# Patient Record
Sex: Female | Born: 1960 | Race: Black or African American | Hispanic: No | Marital: Married | Smoking: Never smoker
Health system: Southern US, Community
[De-identification: ages and names within clinical notes are randomized; demographics above are authoritative.]

## PROBLEM LIST (undated history)

## (undated) DIAGNOSIS — I639 Cerebral infarction, unspecified: Secondary | ICD-10-CM

## (undated) DIAGNOSIS — D649 Anemia, unspecified: Secondary | ICD-10-CM

## (undated) HISTORY — DX: Anemia, unspecified: D64.9

## (undated) HISTORY — PX: TUBAL LIGATION: SHX77

---

## 2011-03-21 ENCOUNTER — Other Ambulatory Visit: Payer: Self-pay | Admitting: Obstetrics & Gynecology

## 2011-03-21 DIAGNOSIS — N63 Unspecified lump in unspecified breast: Secondary | ICD-10-CM

## 2011-03-27 ENCOUNTER — Ambulatory Visit
Admission: RE | Admit: 2011-03-27 | Discharge: 2011-03-27 | Disposition: A | Discharge status: Home / Self Care | Payer: 59 | Source: Ambulatory Visit | Attending: Obstetrics & Gynecology | Admitting: Obstetrics & Gynecology

## 2011-03-27 DIAGNOSIS — N63 Unspecified lump in unspecified breast: Secondary | ICD-10-CM

## 2011-04-03 ENCOUNTER — Other Ambulatory Visit: Payer: Self-pay

## 2012-04-30 ENCOUNTER — Other Ambulatory Visit: Payer: Self-pay | Admitting: Obstetrics & Gynecology

## 2012-04-30 DIAGNOSIS — Z1231 Encounter for screening mammogram for malignant neoplasm of breast: Secondary | ICD-10-CM

## 2012-05-12 ENCOUNTER — Ambulatory Visit
Admission: RE | Admit: 2012-05-12 | Discharge: 2012-05-12 | Disposition: A | Discharge status: Home / Self Care | Payer: 59 | Source: Ambulatory Visit | Attending: Obstetrics & Gynecology | Admitting: Obstetrics & Gynecology

## 2012-05-12 DIAGNOSIS — Z1231 Encounter for screening mammogram for malignant neoplasm of breast: Secondary | ICD-10-CM

## 2012-06-23 ENCOUNTER — Ambulatory Visit (INDEPENDENT_AMBULATORY_CARE_PROVIDER_SITE_OTHER): Payer: 59 | Admitting: Family Medicine

## 2012-06-23 VITALS — BP 130/85 | HR 83 | Temp 98.7°F | Resp 17 | Ht 67.5 in | Wt 184.0 lb

## 2012-06-23 DIAGNOSIS — J029 Acute pharyngitis, unspecified: Secondary | ICD-10-CM

## 2012-06-23 DIAGNOSIS — H109 Unspecified conjunctivitis: Secondary | ICD-10-CM

## 2012-06-23 DIAGNOSIS — J019 Acute sinusitis, unspecified: Secondary | ICD-10-CM

## 2012-06-23 DIAGNOSIS — B009 Herpesviral infection, unspecified: Secondary | ICD-10-CM

## 2012-06-23 DIAGNOSIS — B001 Herpesviral vesicular dermatitis: Secondary | ICD-10-CM

## 2012-06-23 MED ORDER — IPRATROPIUM BROMIDE 0.03 % NA SOLN: 2.0000 | Freq: Two times a day (BID) | NASAL | Status: DC

## 2012-06-23 MED ORDER — HYDROCODONE-HOMATROPINE 5-1.5 MG/5ML PO SYRP: 5.0000 mL | ORAL_SOLUTION | Freq: Three times a day (TID) | ORAL | Status: DC | PRN

## 2012-06-23 MED ORDER — AMOXICILLIN-POT CLAVULANATE 875-125 MG PO TABS: 1.0000 | ORAL_TABLET | Freq: Two times a day (BID) | ORAL | Status: DC

## 2012-06-23 MED ORDER — VALACYCLOVIR HCL 1 G PO TABS: 1000.0000 mg | ORAL_TABLET | Freq: Two times a day (BID) | ORAL | Status: DC

## 2012-06-23 NOTE — Progress Notes (Signed)
  [facility]:  Office Visit  Chief Complaint:  Chief Complaint  Patient presents with  . Cough  . Sore Throat  . Mouth Lesions  . Conjunctivitis    HPI: Christina Daniels is a 52 y.o. female who complains of  Sore throat, Sinus congestion, HA and cold sores, and also some yellow drainage in left eye only. Denies fevers.  Chills. + Emergency planning/management officer, works from home. Has tried Abreva for lesions on mouth, throat lozenges and spray without relief. Did not get flu vaccine this year.   Past Medical History  Diagnosis Date  . Anemia    Past Surgical History  Procedure Date  . Tubal ligation    History   Social History  . Marital Status: Married    Spouse Name: N/A    Number of Children: N/A  . Years of Education: N/A   Social History Main Topics  . Smoking status: Never Smoker   . Smokeless tobacco: None  . Alcohol Use: No  . Drug Use: No  . Sexually Active: Yes    Birth Control/ Protection: None   Other Topics Concern  . None   Social History Narrative  . None   History reviewed. No pertinent family history. No Known Allergies Prior to Admission medications   Not on File     ROS: The patient denies fevers, chills, night sweats, unintentional weight loss, chest pain, palpitations, wheezing, dyspnea on exertion, nausea, vomiting, abdominal pain, dysuria, hematuria, melena, numbness, weakness, or tingling.   All other systems have been reviewed and were otherwise negative with the exception of those mentioned in the HPI and as above.    PHYSICAL EXAM: Filed Vitals:   06/23/12 1114  BP: 130/85  Pulse: 83  Temp: 98.7 F (37.1 C)  Resp: 17   Filed Vitals:   06/23/12 1114  Height: 5' 7.5" (1.715 m)  Weight: 184 lb (83.462 kg)   Body mass index is 28.39 kg/(m^2).  General: Alert, no acute distress HEENT:  Normocephalic, atraumatic, oropharynx patent. EOMI, slightly pink conjunctiva in left, + sinus tenderness. Boggy nares. Fundoscopic exa m  normal + cold sores on lip Cardiovascular:  Regular rate and rhythm, no rubs murmurs or gallops.  No Carotid bruits, radial pulse intact. No pedal edema.  Respiratory: Clear to auscultation bilaterally.  No wheezes, rales, or rhonchi.  No cyanosis, no use of accessory musculature GI: No organomegaly, abdomen is soft and non-tender, positive bowel sounds.  No masses. Skin: No rashes. Neurologic: Facial musculature symmetric. Psychiatric: Patient is appropriate throughout our interaction. Lymphatic: No cervical lymphadenopathy Musculoskeletal: Gait intact.   LABS: No results found for this or any previous visit.   EKG/XRAY:   Primary read interpreted by Dr. Conley Rolls at [facility].   ASSESSMENT/PLAN: Encounter Diagnoses  Name Primary?  . Acute sinusitis Yes  . Conjunctivitis   . Recurrent cold sores   . Pharyngitis    Rx Augmentin, Valtrex and Hydromet syrup Symptomatic treatment for sore throat with otc meds, fluids F/u prn    ,  PHUONG, DO 06/23/2012 12:26 PM

## 2012-09-23 IMAGING — US US BREAST*L*
1 series · 8 of 8 positions shown · non-contrast
Comparison: None.

CLINICAL DATA: Painful mass recently felt by the patient and on
recent physical examination in the 3 o'clock position of the left
breast.  She does not feel the mass today.

DIGITAL DIAGNOSTIC BILATERAL MAMMOGRAM WITH CAD AND LEFT BREAST
ULTRASOUND:

[Series 1: us breast*left* · 8 of 8 slices shown]
[im 1/8]
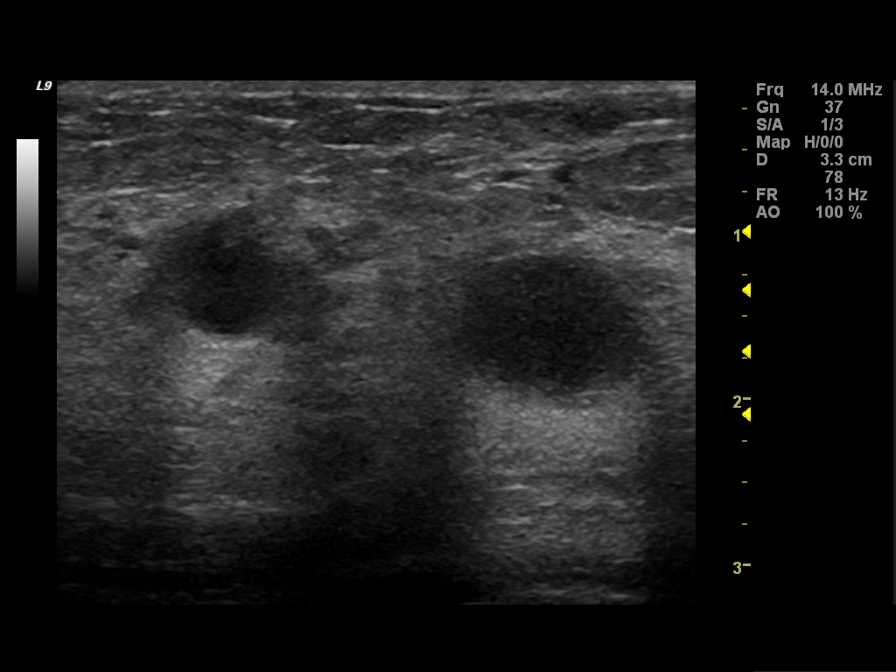
[im 2/8]
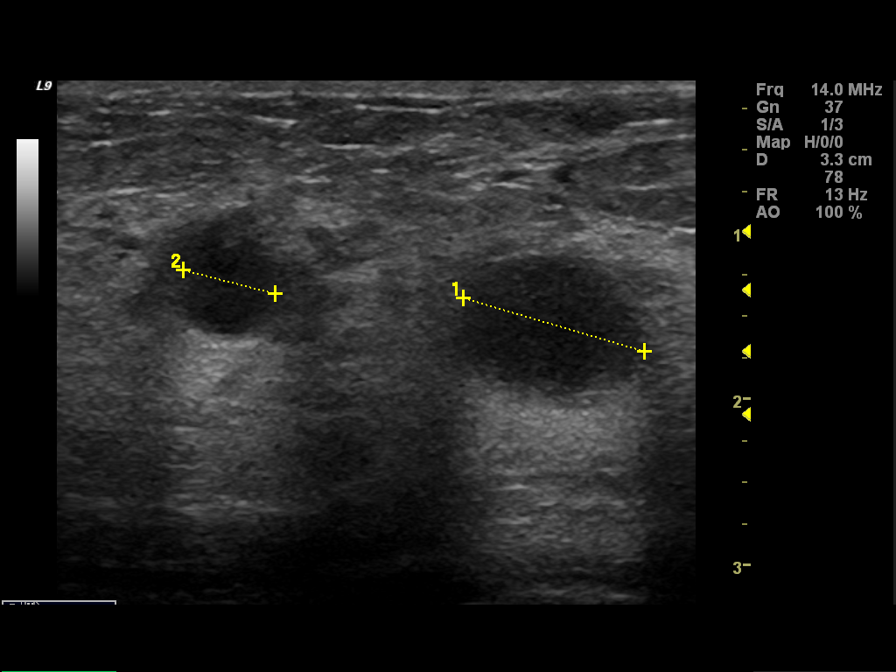
[im 3/8]
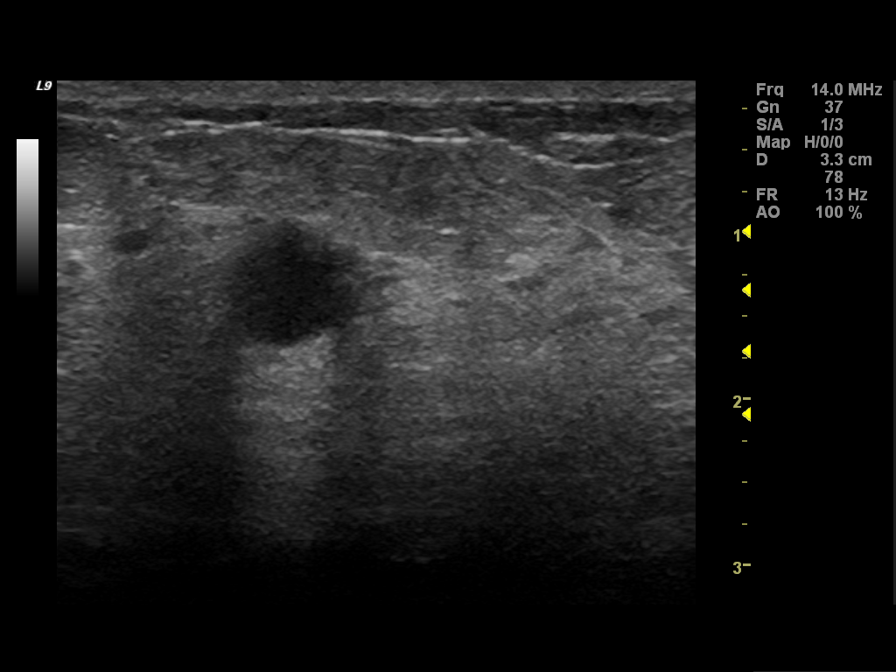
[im 4/8]
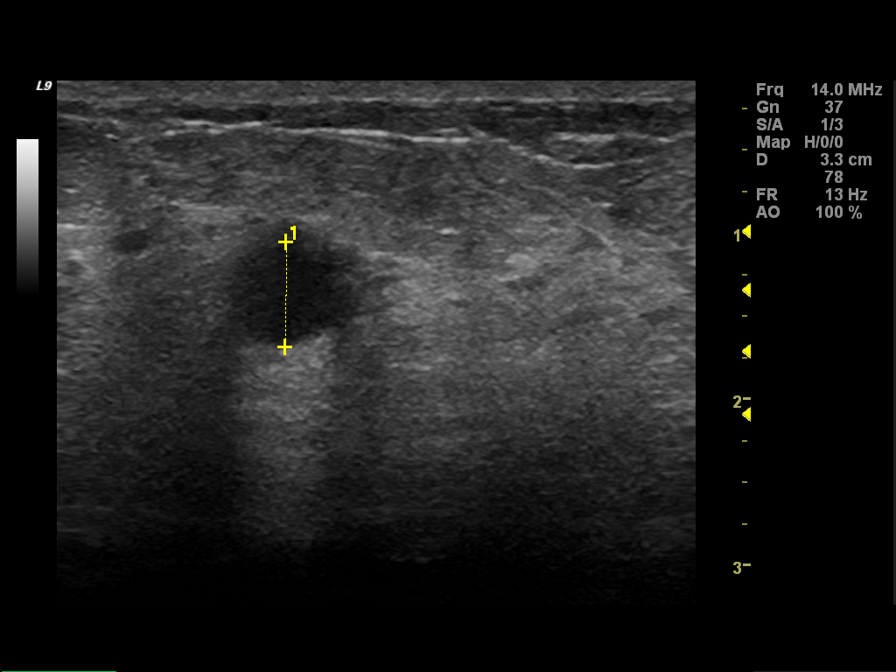
[im 5/8]
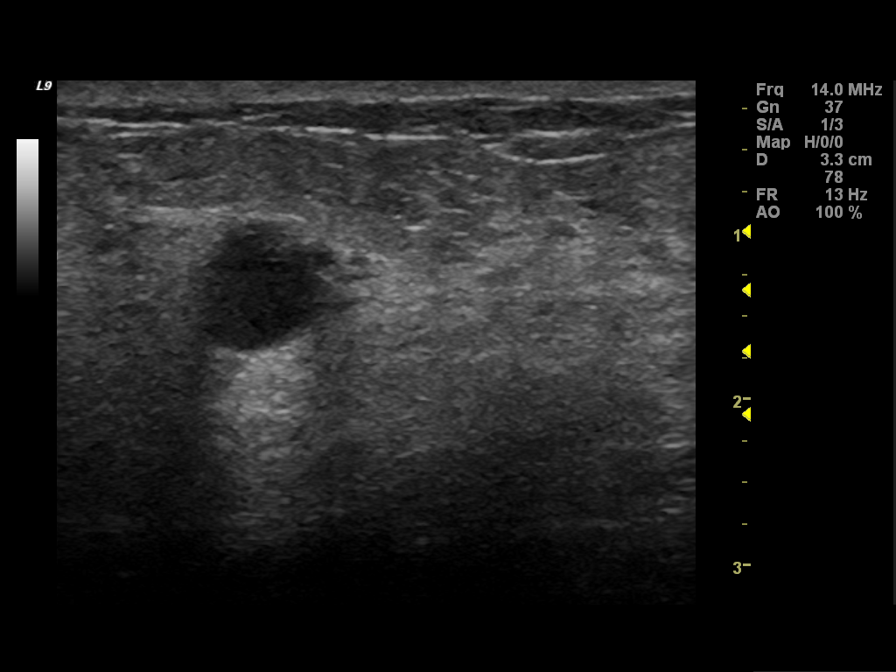
[im 6/8]
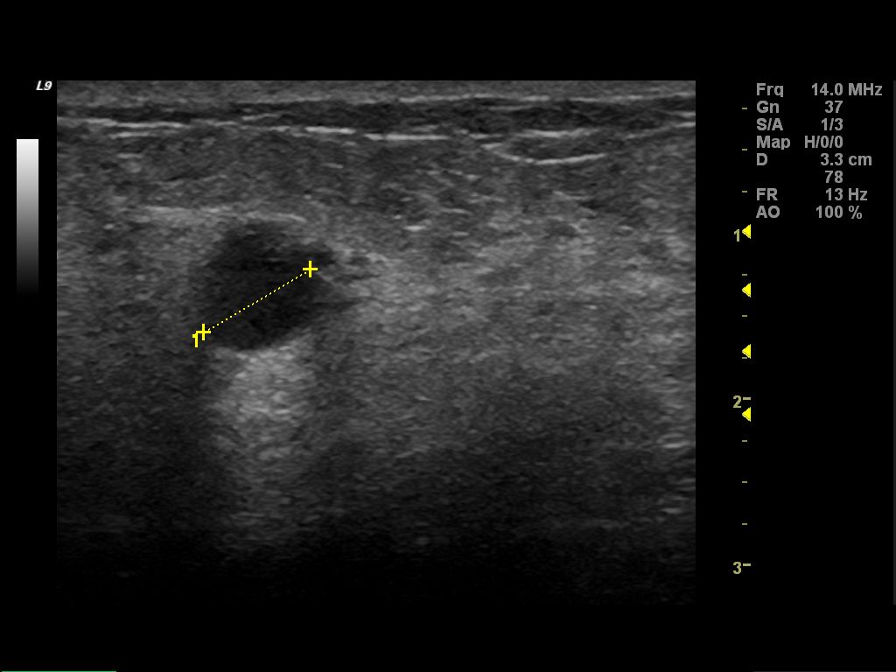
[im 7/8]
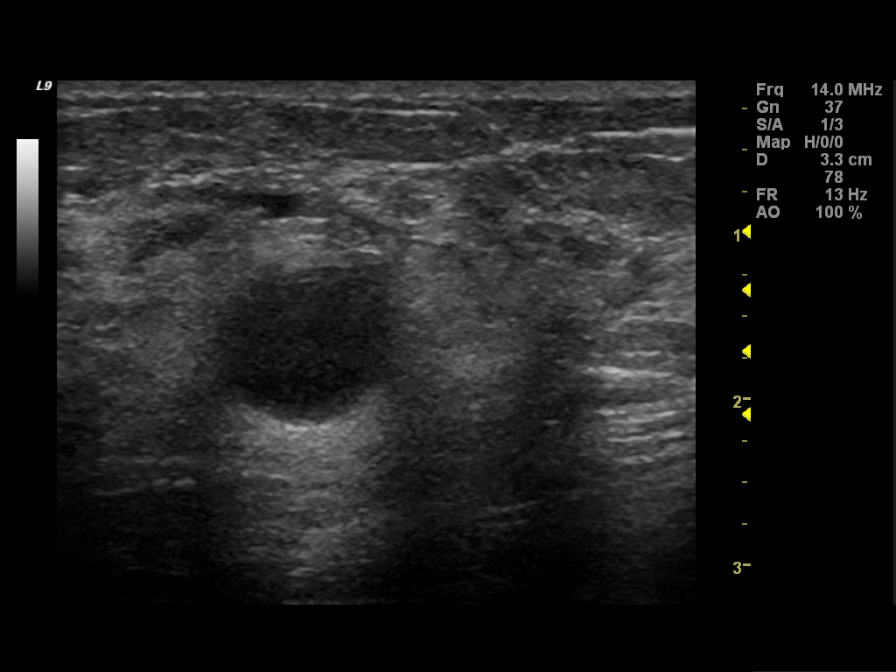
[im 8/8]
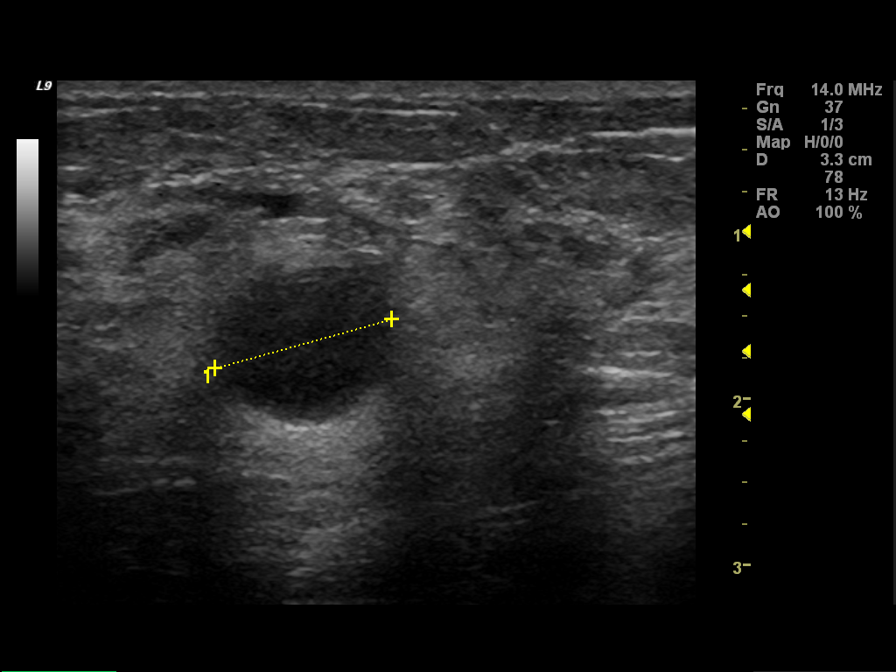

[8 of 8 positions shown; findings below may reference images not displayed]

FINDINGS: Heterogeneously dense parenchyma in both breasts with no
mammographic findings suspicious for malignancy in either breast.
Mammographic images were processed with CAD.

On physical exam, the patient has an approximately 4 cm oval area
of palpable soft tissue thickening in the 3 o'clock position of the
left breast, 10 cm from the nipple.

Ultrasound is performed, showing dense glandular tissue in the 3
o'clock position of the left breast in the region of palpable soft
tissue thickening.  There is also a 1.1 cm cyst in that area as
well as an additional 0.7 cm cyst and a third smaller cyst.  No
solid masses were seen.
IMPRESSION: Left breast cysts.  No evidence of malignancy.  Annual screening
mammography is recommended.

BI-RADS CATEGORY 2:  Benign finding(s).

Recommendation:  Bilateral screening mammogram in 1 year.

## 2013-04-20 ENCOUNTER — Other Ambulatory Visit: Payer: Self-pay

## 2013-04-20 DIAGNOSIS — Z1231 Encounter for screening mammogram for malignant neoplasm of breast: Secondary | ICD-10-CM

## 2013-05-01 ENCOUNTER — Ambulatory Visit: Payer: 59

## 2013-07-27 ENCOUNTER — Ambulatory Visit
Admission: RE | Admit: 2013-07-27 | Discharge: 2013-07-27 | Disposition: A | Discharge status: Home / Self Care | Payer: 59 | Source: Ambulatory Visit

## 2013-07-27 DIAGNOSIS — Z1231 Encounter for screening mammogram for malignant neoplasm of breast: Secondary | ICD-10-CM

## 2013-12-19 ENCOUNTER — Encounter (HOSPITAL_COMMUNITY): Payer: Self-pay | Admitting: Emergency Medicine

## 2013-12-19 ENCOUNTER — Emergency Department (HOSPITAL_COMMUNITY)
Admission: EM | Admit: 2013-12-19 | Discharge: 2013-12-19 | Disposition: A | Discharge status: Home / Self Care | Payer: 59 | Attending: Emergency Medicine | Admitting: Emergency Medicine

## 2013-12-19 DIAGNOSIS — Z862 Personal history of diseases of the blood and blood-forming organs and certain disorders involving the immune mechanism: Secondary | ICD-10-CM | POA: Insufficient documentation

## 2013-12-19 DIAGNOSIS — M7989 Other specified soft tissue disorders: Secondary | ICD-10-CM | POA: Insufficient documentation

## 2013-12-19 DIAGNOSIS — M79602 Pain in left arm: Secondary | ICD-10-CM

## 2013-12-19 DIAGNOSIS — M79609 Pain in unspecified limb: Secondary | ICD-10-CM | POA: Insufficient documentation

## 2013-12-19 DIAGNOSIS — Z8673 Personal history of transient ischemic attack (TIA), and cerebral infarction without residual deficits: Secondary | ICD-10-CM | POA: Insufficient documentation

## 2013-12-19 HISTORY — DX: Cerebral infarction, unspecified: I63.9

## 2013-12-19 LAB — COMPREHENSIVE METABOLIC PANEL
ALBUMIN: 3.8 g/dL (ref 3.5–5.2)
ALK PHOS: 61 U/L (ref 39–117)
ALT: 19 U/L (ref 0–35)
ANION GAP: 15 (ref 5–15)
AST: 24 U/L (ref 0–37)
BUN: 15 mg/dL (ref 6–23)
CALCIUM: 9.6 mg/dL (ref 8.4–10.5)
CO2: 24 mEq/L (ref 19–32)
CREATININE: 0.8 mg/dL (ref 0.50–1.10)
Chloride: 103 mEq/L (ref 96–112)
GFR calc non Af Amer: 84 mL/min — ABNORMAL LOW (ref >90)
GLUCOSE: 117 mg/dL — AB (ref 70–99)
POTASSIUM: 4.2 meq/L (ref 3.7–5.3)
Sodium: 142 mEq/L (ref 137–147)
TOTAL PROTEIN: 7.7 g/dL (ref 6.0–8.3)
Total Bilirubin: <0.2 mg/dL — ABNORMAL LOW (ref 0.3–1.2)

## 2013-12-19 LAB — CBC WITH DIFFERENTIAL/PLATELET
BASOS ABS: 0 10*3/uL (ref 0.0–0.1)
Basophils Relative: 0 % (ref 0–1)
Eosinophils Absolute: 0.2 10*3/uL (ref 0.0–0.7)
Eosinophils Relative: 3 % (ref 0–5)
HEMATOCRIT: 36.7 % (ref 36.0–46.0)
Hemoglobin: 12.4 g/dL (ref 12.0–15.0)
LYMPHS ABS: 3.3 10*3/uL (ref 0.7–4.0)
LYMPHS PCT: 42 % (ref 12–46)
MCH: 29.4 pg (ref 26.0–34.0)
MCHC: 33.8 g/dL (ref 30.0–36.0)
MCV: 87 fL (ref 78.0–100.0)
Monocytes Absolute: 0.6 10*3/uL (ref 0.1–1.0)
Monocytes Relative: 7 % (ref 3–12)
NEUTROS ABS: 3.7 10*3/uL (ref 1.7–7.7)
Neutrophils Relative %: 48 % (ref 43–77)
PLATELETS: 351 10*3/uL (ref 150–400)
RBC: 4.22 MIL/uL (ref 3.87–5.11)
RDW: 14.8 % (ref 11.5–15.5)
WBC: 7.8 10*3/uL (ref 4.0–10.5)

## 2013-12-19 LAB — TROPONIN I

## 2013-12-19 NOTE — Discharge Instructions (Signed)
-   place warm compress on the site  - take ibuprofen for pain  - return with any increased swelling or redness

## 2013-12-19 NOTE — ED Notes (Signed)
The pt has ha d a lt arm pain with lt arm weakness for 2 days.  The pain  Comes from her lt shoulder down her lt arm.  She also has a nodule to her lt a-c area

## 2013-12-19 NOTE — ED Provider Notes (Signed)
CSN: [id number]     Arrival date & time 12/19/13  1919 History   First MD Initiated Contact with Patient 12/19/13 1953     Chief Complaint  Patient presents with  . Stroke Symptoms   53 y/o female with PMH of ischemic stroke that presents with left arm pain and swelling with onset 2 days prior. Patient has focal swelling around the elbow that is not uniform and not cirmcumferential of the whole arm. No recent trauma to arm. Patient has not tried any treatment at home. The patient does have weakness of the arm but states that this is secondary to pain and not inability. Patient is concerned that this represents a blood clot. The arm is not arm, no signs of infection and not fevers. The patient denies history of DVT or PE in the past. The arm is not significantly larger than the right arm.    (Consider location/radiation/quality/duration/timing/severity/associated sxs/prior Treatment) Patient is a 53 y.o. female presenting with extremity pain.  Extremity Pain This is a new problem. The current episode started in the past 7 days. The problem occurs constantly. The problem has been unchanged. Pertinent negatives include no abdominal pain, chest pain, congestion, coughing, headaches, nausea or vomiting. The symptoms are aggravated by exertion and bending.    Past Medical History  Diagnosis Date  . Anemia   . Stroke    Past Surgical History  Procedure Laterality Date  . Tubal ligation     No family history on file. History  Substance Use Topics  . Smoking status: Never Smoker   . Smokeless tobacco: Not on file  . Alcohol Use: No   OB History   Grav Para Term Preterm Abortions TAB SAB Ect Mult Living                 Review of Systems  Constitutional: Negative for activity change.  HENT: Negative for congestion.   Respiratory: Negative for cough and shortness of breath.   Cardiovascular: Negative for chest pain and leg swelling.  Gastrointestinal: Negative for nausea, vomiting,  abdominal pain, diarrhea, constipation, blood in stool and abdominal distention.  Genitourinary: Negative for dysuria, flank pain and vaginal discharge.  Musculoskeletal: Negative for back pain.  Skin: Negative for color change.  Neurological: Negative for syncope and headaches.  Psychiatric/Behavioral: Negative for agitation.      Allergies  Review of patient's allergies indicates no known allergies.  Home Medications   Prior to Admission medications   Medication Sig Start Date End Date Taking? Authorizing Provider  Ascorbic Acid (VITAMIN C PO) Take 1 tablet by mouth daily.   Yes Historical Provider, MD  Cyanocobalamin (B-12 PO) Take 1 tablet by mouth daily.   Yes Historical Provider, MD  Omega-3 Fatty Acids (FISH OIL PO) Take 1 capsule by mouth daily.   Yes Historical Provider, MD  HYDROcodone-homatropine (HYDROMET) 5-1.5 MG/5ML syrup Take 5 mLs by mouth every 8 (eight) hours as needed for cough. 06/23/12   Thao P Le, DO  ipratropium (ATROVENT) 0.03 % nasal spray Place 2 sprays into the nose every 12 (twelve) hours. 06/23/12   Thao P Le, DO  valACYclovir (VALTREX) 1000 MG tablet Take 1 tablet (1,000 mg total) by mouth 2 (two) times daily. 06/23/12   Thao P Le, DO   BP 126/66  Pulse 82  Temp(Src) 98 F (36.7 C)  Resp 18  Wt 186 lb 6 oz (84.539 kg)  SpO2 100%  LMP 12/18/2013 Physical Exam  Constitutional: She is oriented to person,  place, and time. She appears well-developed.  HENT:  Head: Normocephalic.  Eyes: Pupils are equal, round, and reactive to light.  Neck: Neck supple.  Cardiovascular: Normal rate.  Exam reveals no gallop and no friction rub.   No murmur heard. Pulmonary/Chest: Effort normal and breath sounds normal. No respiratory distress.  Abdominal: Soft. She exhibits no distension. There is no tenderness. There is no rebound.  Musculoskeletal: She exhibits no edema.  Swelling and point tenderness over the left elbow, no uniform swelling, no signs of infected joint.   Full range of motion of join   Neurological: She is alert and oriented to person, place, and time.  Cranial nerves intact  Arm strength is subjectively less in the left hand but attributed to pain. Sensation intact, pulses intact.  Lower extremities have equal strength and sensation is intact.   Skin: Skin is warm.  Psychiatric: She has a normal mood and affect.    ED Course  Procedures (including critical care time) Labs Review Labs Reviewed  CBC WITH DIFFERENTIAL  COMPREHENSIVE METABOLIC PANEL  TROPONIN I    Imaging Review No results found.   EKG Interpretation None      MDM   Final diagnoses:  Pain of left upper extremity   53 y/o female that presents with left arm swelling and pain. Swelling in point tender and not uniform. This swelling is unlikely to represent a blood clot. Arm has some notable weakness secondary to pain, however the patient does not have any other focal neuro finding and states that the weakness is not due to inability. The joint is able to be ranged and unlikely represents an infected joint.  Patient evaluated to CBC, CMP and trop that were all non-contributory.  It was determined that this is most likely MSK in nature. The patient was instructed to do RICE therapy and warm compresses at home and try a course of ibuprofen. She will follow-up with PCP if this does not improve over the next few days. Return precautions were given and the patient was discharged home.     Clement SayresStaci Jodine Muchmore, MD 12/20/13 [phone]

## 2013-12-20 NOTE — ED Provider Notes (Signed)
Medical screening examination/treatment/procedure(s) were conducted as a shared visit with resident physician and myself.  I personally evaluated the patient during the encounter.    EKG Interpretation  Date/Time:  Saturday December 19 2013 19:44:31 EDT Ventricular Rate:  83 PR Interval:  162 QRS Duration: 70 QT Interval:  378 QTC Calculation: 444 R Axis:   29 Text Interpretation:  Normal sinus rhythm Normal ECG Confirmed by Izabellah Dadisman  MD, Ladamien Rammel ([id number]) on 12/19/2013 9:28:14 PM      I interviewed and examined the patient. Lungs are CTAB. Cardiac exam wnl. Abdomen soft. Very faint erythema and ttp of left medial elbow. No real appreciable swelling. No bony ttp. Pt denies injury. Sensation and motor skills intact in LUE on my exam. 2+ pulses in LUE. Could be as simple as an insect sting given faint erythema and pt being outside. Doubt blood clot as pt has no real RF's for this and exam inconsistent. Do not think this is a CVA or anginal equivalent w/ lack of cp and point ttp of arm. Labs/ecg unremarkable. Pt is from out of town. Will recommend tylenol and warm compresses and f/u w/ her doctor back home (she is leaving tomorrow). Return precautions discussed.   Junius ArgyleForrest S Clete Kuch, MD 12/20/13 1130

## 2014-05-04 ENCOUNTER — Other Ambulatory Visit: Payer: Self-pay

## 2014-05-04 DIAGNOSIS — Z1231 Encounter for screening mammogram for malignant neoplasm of breast: Secondary | ICD-10-CM

## 2015-01-24 IMAGING — MG MM DIGITAL SCREENING BILAT W/ CAD
4 series · 4 of 4 positions shown · non-contrast
Comparison: Previous exam(s).

CLINICAL DATA: Screening.

EXAM:
DIGITAL SCREENING BILATERAL MAMMOGRAM WITH CAD

[R CC]
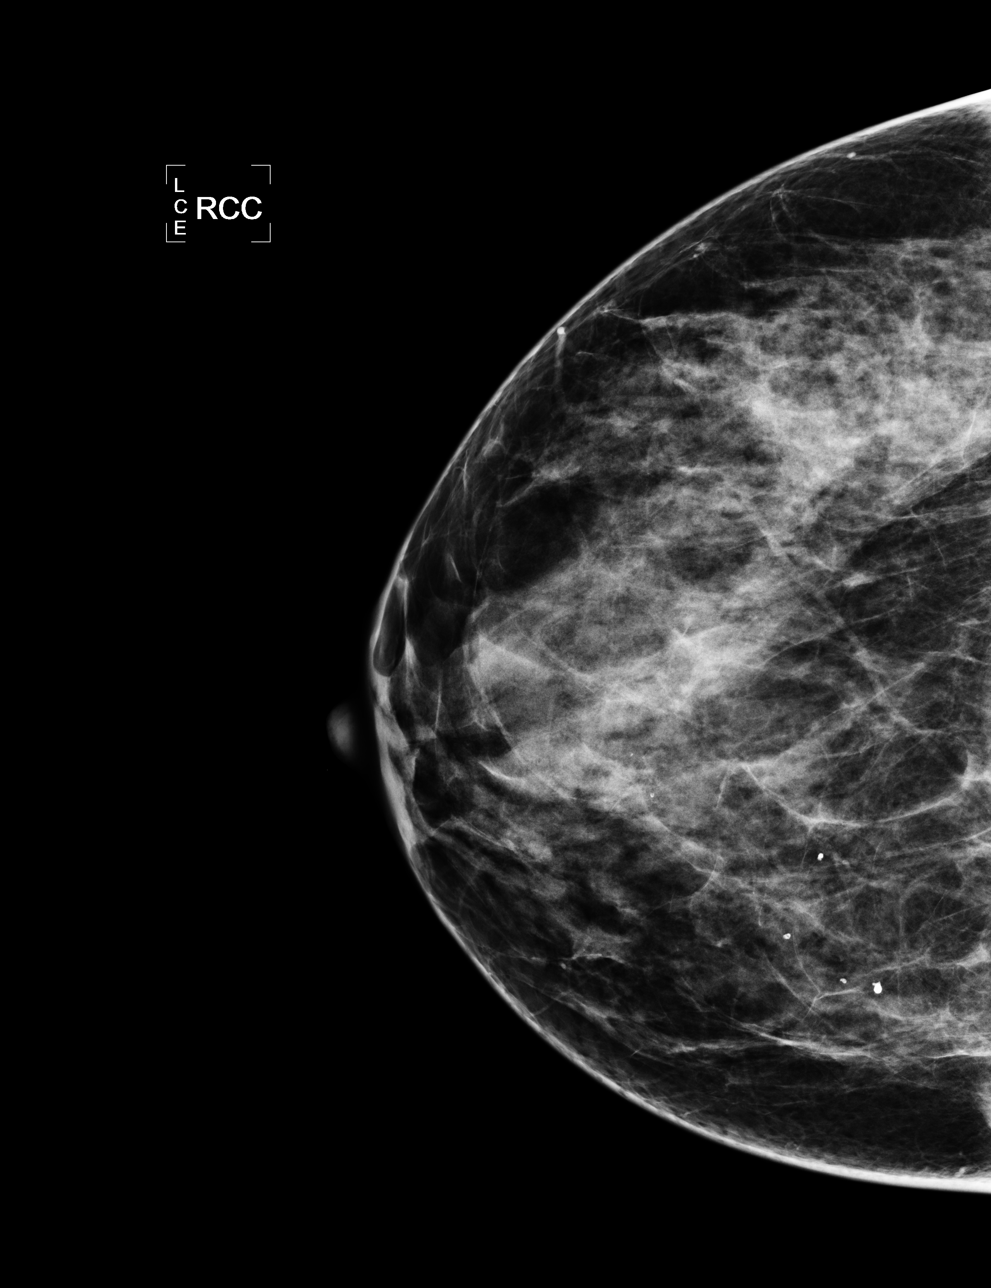

[L CC]
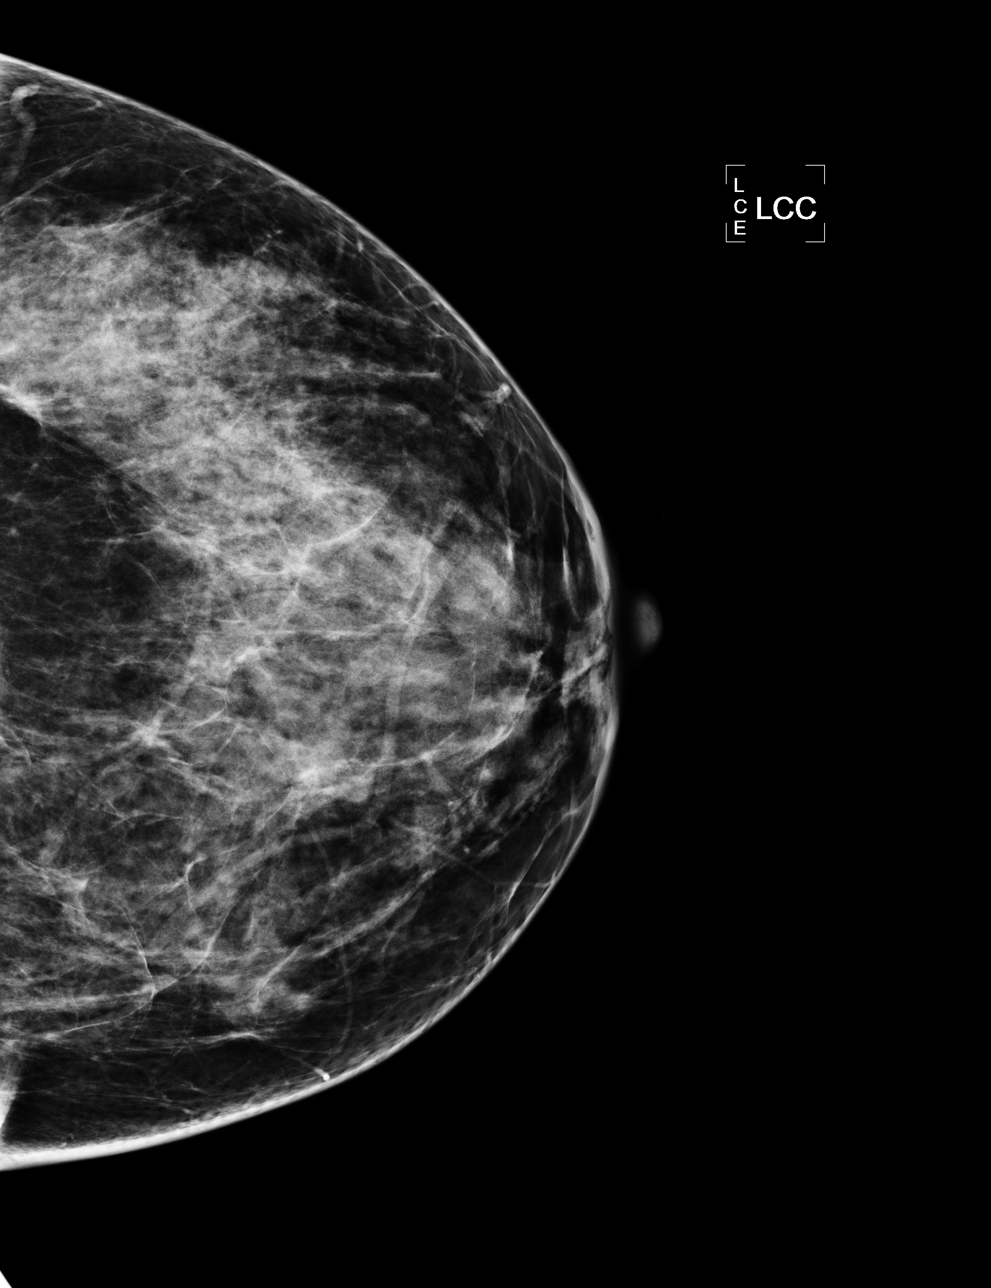

[L MLO]
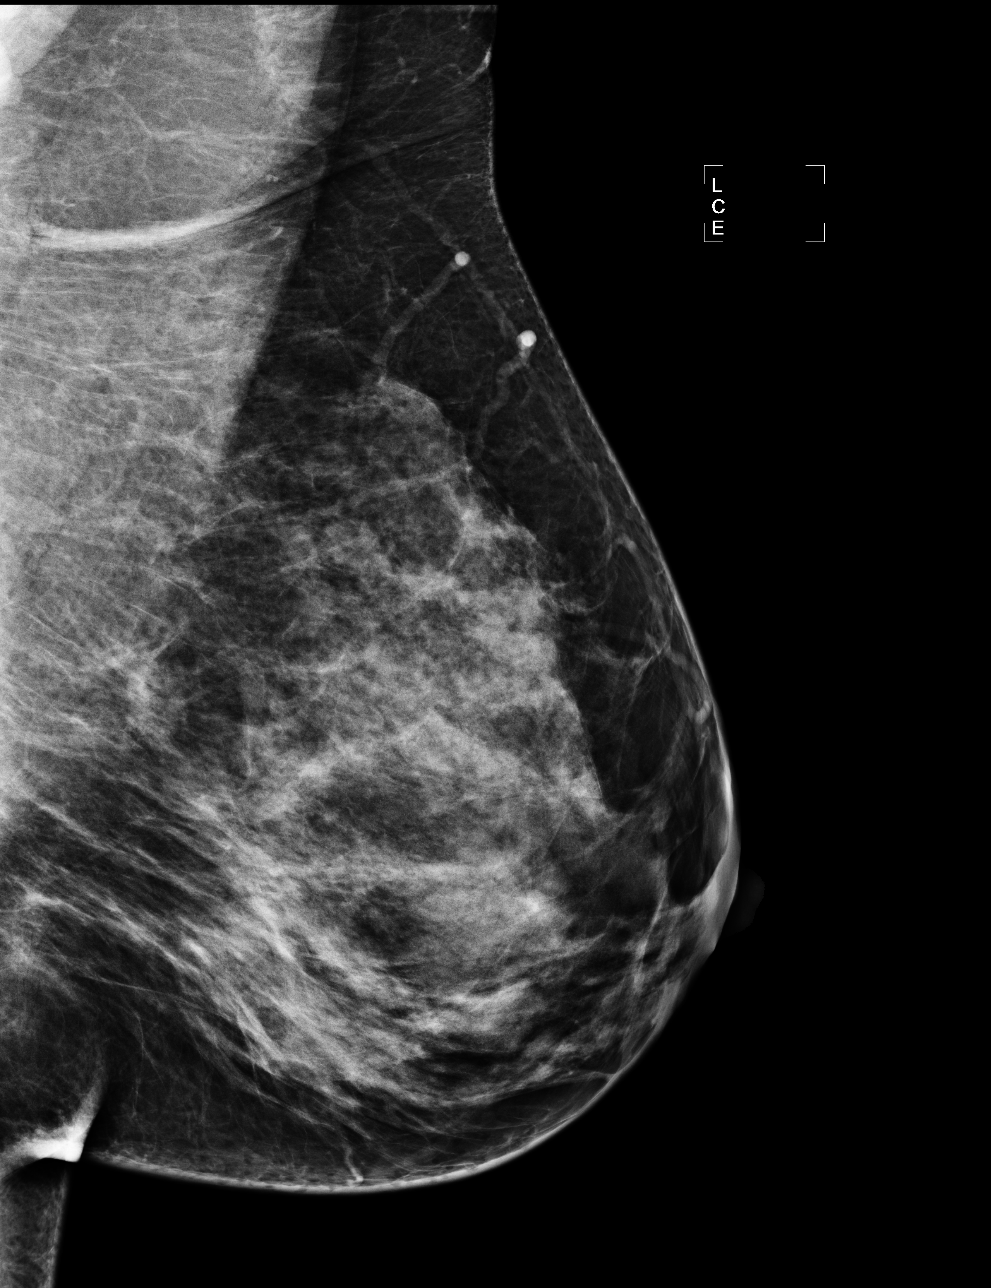

[R MLO]
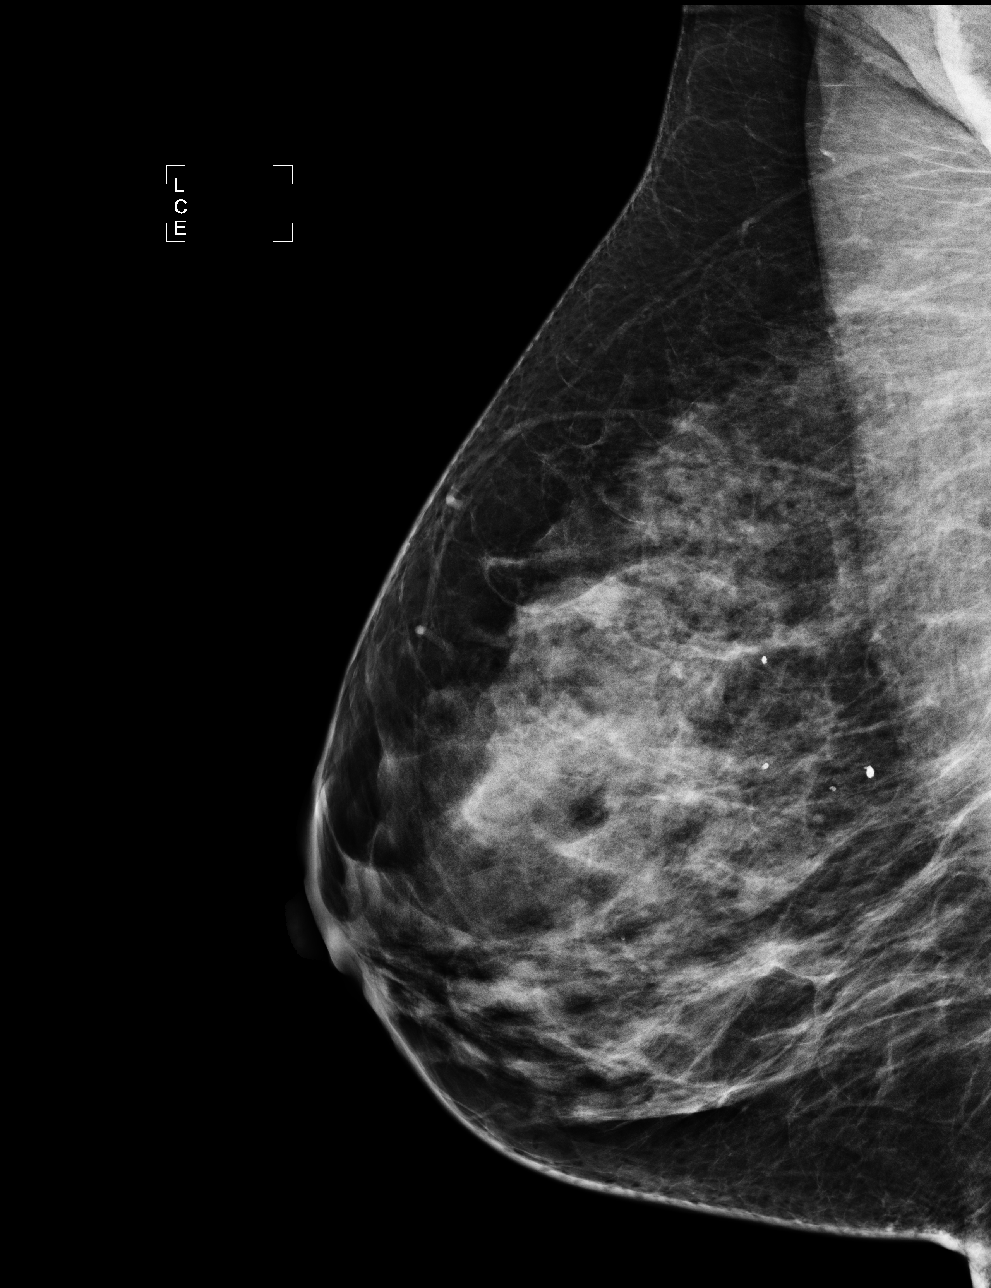

[4 of 4 positions shown; findings below may reference images not displayed]

ACR Breast Density Category d: The breast tissue is extremely dense,
which lowers the sensitivity of mammography.
FINDINGS: There are no findings suspicious for malignancy. Images were
processed with CAD.
IMPRESSION: No mammographic evidence of malignancy. A result letter of this
screening mammogram will be mailed directly to the patient.

RECOMMENDATION:
Screening mammogram in one year. (Code:[id number]-[id number])

BI-RADS CATEGORY  1: Negative.
